# Patient Record
Sex: Male | Born: 1970 | Race: White | Hispanic: Yes | Marital: Single | Smoking: Current every day smoker
Health system: Southern US, Community
[De-identification: ages and names within clinical notes are randomized; demographics above are authoritative.]

## PROBLEM LIST (undated history)

## (undated) DIAGNOSIS — E669 Obesity, unspecified: Secondary | ICD-10-CM

## (undated) DIAGNOSIS — R7302 Impaired glucose tolerance (oral): Secondary | ICD-10-CM

## (undated) DIAGNOSIS — R030 Elevated blood-pressure reading, without diagnosis of hypertension: Secondary | ICD-10-CM

## (undated) DIAGNOSIS — R55 Syncope and collapse: Secondary | ICD-10-CM

## (undated) HISTORY — PX: BACK SURGERY: SHX140

---

## 2000-11-29 HISTORY — PX: BACK SURGERY: SHX140

## 2009-04-09 ENCOUNTER — Emergency Department (HOSPITAL_COMMUNITY): Admission: EM | Admit: 2009-04-09 | Discharge: 2009-04-09 | Payer: Self-pay | Admitting: Emergency Medicine

## 2010-09-18 ENCOUNTER — Emergency Department (HOSPITAL_COMMUNITY): Admission: EM | Admit: 2010-09-18 | Discharge: 2010-09-18 | Payer: Self-pay | Admitting: Emergency Medicine

## 2011-02-10 LAB — DIFFERENTIAL
Basophils Absolute: 0.1 10*3/uL (ref 0.0–0.1)
Basophils Relative: 1 % (ref 0–1)
Lymphocytes Relative: 29 % (ref 12–46)
Monocytes Absolute: 1 10*3/uL (ref 0.1–1.0)
Monocytes Relative: 11 % (ref 3–12)
Neutro Abs: 5.4 10*3/uL (ref 1.7–7.7)
Neutrophils Relative %: 56 % (ref 43–77)

## 2011-02-10 LAB — URINALYSIS, ROUTINE W REFLEX MICROSCOPIC
Glucose, UA: NEGATIVE mg/dL
Nitrite: NEGATIVE
Specific Gravity, Urine: 1.031 — ABNORMAL HIGH (ref 1.005–1.030)
pH: 7 (ref 5.0–8.0)

## 2011-02-10 LAB — POCT I-STAT, CHEM 8
BUN: 15 mg/dL (ref 6–23)
Chloride: 103 mEq/L (ref 96–112)
Glucose, Bld: 93 mg/dL (ref 70–99)
HCT: 46 % (ref 39.0–52.0)
Potassium: 4 mEq/L (ref 3.5–5.1)

## 2011-02-10 LAB — CBC
HCT: 43.7 % (ref 39.0–52.0)
Hemoglobin: 15 g/dL (ref 13.0–17.0)
MCHC: 34.3 g/dL (ref 30.0–36.0)
WBC: 9.6 10*3/uL (ref 4.0–10.5)

## 2011-03-09 LAB — CBC
HCT: 41.1 % (ref 39.0–52.0)
Hemoglobin: 14.3 g/dL (ref 13.0–17.0)
MCV: 90.8 fL (ref 78.0–100.0)
Platelets: 257 10*3/uL (ref 150–400)
RBC: 4.53 MIL/uL (ref 4.22–5.81)
WBC: 11.1 10*3/uL — ABNORMAL HIGH (ref 4.0–10.5)

## 2011-03-09 LAB — DIFFERENTIAL
Eosinophils Relative: 2 % (ref 0–5)
Lymphocytes Relative: 17 % (ref 12–46)
Lymphs Abs: 1.9 10*3/uL (ref 0.7–4.0)
Monocytes Absolute: 0.6 10*3/uL (ref 0.1–1.0)
Monocytes Relative: 6 % (ref 3–12)

## 2011-03-09 LAB — BASIC METABOLIC PANEL
Chloride: 105 mEq/L (ref 96–112)
GFR calc Af Amer: >60 mL/min (ref >60)
GFR calc non Af Amer: >60 mL/min (ref >60)
Potassium: 4 mEq/L (ref 3.5–5.1)
Sodium: 138 mEq/L (ref 135–145)

## 2013-11-10 ENCOUNTER — Emergency Department (HOSPITAL_COMMUNITY): Payer: Self-pay

## 2013-11-10 ENCOUNTER — Encounter (HOSPITAL_COMMUNITY): Payer: Self-pay | Admitting: Emergency Medicine

## 2013-11-10 DIAGNOSIS — R071 Chest pain on breathing: Secondary | ICD-10-CM | POA: Insufficient documentation

## 2013-11-10 DIAGNOSIS — I252 Old myocardial infarction: Secondary | ICD-10-CM | POA: Insufficient documentation

## 2013-11-10 DIAGNOSIS — F17200 Nicotine dependence, unspecified, uncomplicated: Secondary | ICD-10-CM | POA: Insufficient documentation

## 2013-11-10 NOTE — ED Notes (Signed)
The pt has a sudden onset of rt sided upper chest pain while driving approx 35 minutes ago.  No previous history.  No son no nv just pain.  The pain is better at present

## 2013-11-10 NOTE — ED Notes (Signed)
The pt reports that he had a mi when he was 58

## 2013-11-11 ENCOUNTER — Emergency Department (HOSPITAL_COMMUNITY)
Admission: EM | Admit: 2013-11-11 | Discharge: 2013-11-11 | Disposition: A | Discharge status: Home / Self Care | Payer: Self-pay | Attending: Emergency Medicine | Admitting: Emergency Medicine

## 2013-11-11 DIAGNOSIS — R079 Chest pain, unspecified: Secondary | ICD-10-CM

## 2013-11-11 LAB — COMPREHENSIVE METABOLIC PANEL
ALT: 67 U/L — ABNORMAL HIGH (ref 0–53)
Albumin: 4.7 g/dL (ref 3.5–5.2)
Alkaline Phosphatase: 74 U/L (ref 39–117)
BUN: 16 mg/dL (ref 6–23)
Chloride: 101 mEq/L (ref 96–112)
Potassium: 3.7 mEq/L (ref 3.5–5.1)
Sodium: 140 mEq/L (ref 135–145)
Total Bilirubin: 0.3 mg/dL (ref 0.3–1.2)
Total Protein: 7.9 g/dL (ref 6.0–8.3)

## 2013-11-11 LAB — CBC WITH DIFFERENTIAL/PLATELET
Basophils Relative: 1 % (ref 0–1)
HCT: 44.3 % (ref 39.0–52.0)
Lymphs Abs: 4.1 10*3/uL — ABNORMAL HIGH (ref 0.7–4.0)
MCHC: 35 g/dL (ref 30.0–36.0)
Monocytes Relative: 7 % (ref 3–12)
Neutro Abs: 3.7 10*3/uL (ref 1.7–7.7)
Neutrophils Relative %: 43 % (ref 43–77)
Platelets: 255 10*3/uL (ref 150–400)
WBC: 8.7 10*3/uL (ref 4.0–10.5)

## 2013-11-11 LAB — TROPONIN I: Troponin I: <0.3 ng/mL (ref <0.30)

## 2013-11-11 MED ORDER — HYDROMORPHONE HCL PF 1 MG/ML IJ SOLN
1.0000 mg | Freq: Once | INTRAMUSCULAR | Status: AC
  Administered 2013-11-11: 1 mg via INTRAMUSCULAR
  Filled 2013-11-11: qty 1

## 2013-11-11 MED ORDER — OXYCODONE-ACETAMINOPHEN 5-325 MG PO TABS
2.0000 | ORAL_TABLET | Freq: Once | ORAL | Status: AC
  Administered 2013-11-11: 2 via ORAL
  Filled 2013-11-11: qty 2

## 2013-11-11 MED ORDER — HYDROCODONE-ACETAMINOPHEN 5-325 MG PO TABS: 1.0000 | ORAL_TABLET | ORAL | Status: DC | PRN

## 2013-11-11 MED ORDER — IBUPROFEN 400 MG PO TABS
600.0000 mg | ORAL_TABLET | Freq: Once | ORAL | Status: AC
  Administered 2013-11-11: 600 mg via ORAL
  Filled 2013-11-11 (×2): qty 1

## 2013-11-11 NOTE — ED Notes (Signed)
Gave pt ice pack.

## 2013-11-11 NOTE — ED Provider Notes (Signed)
CSN: [id number]     Arrival date & time 11/10/13  2304 History   First MD Initiated Contact with Patient 11/11/13 0058     Chief Complaint  Patient presents with  . Chest Pain    HPI Patient presents with acute onset right-sided chest pain while driving in the car and laughing with his wife.  He states his pain was sharp and severe.  His pain is constant.  His pain is worse with deep breathing and with palpation of his right anterior chest.  No shortness of breath.  No recent illness.  No fevers or chills.  Pain is mild to moderate in severity Past Medical History  Diagnosis Date  . MI (myocardial infarction)    History reviewed. No pertinent past surgical history. No family history on file. History  Substance Use Topics  . Smoking status: Current Every Day Smoker  . Smokeless tobacco: Not on file  . Alcohol Use: Yes    Review of Systems  All other systems reviewed and are negative.    Allergies  Review of patient's allergies indicates not on file.  Home Medications   Current Outpatient Rx  Name  Route  Sig  Dispense  Refill  . HYDROcodone-acetaminophen (NORCO/VICODIN) 5-325 MG per tablet   Oral   Take 1 tablet by mouth every 4 (four) hours as needed for moderate pain.   15 tablet   0    BP 115/57  Pulse 79  Temp(Src) 99.2 F (37.3 C) (Oral)  Resp 21  Wt 245 lb 6 oz (111.301 kg)  SpO2 90% Physical Exam  Nursing note and vitals reviewed. Constitutional: He is oriented to person, place, and time. He appears well-developed and well-nourished.  HENT:  Head: Normocephalic and atraumatic.  Eyes: EOM are normal.  Neck: Normal range of motion.  Cardiovascular: Normal rate, regular rhythm, normal heart sounds and intact distal pulses.   Pulmonary/Chest: Effort normal and breath sounds normal. No respiratory distress.  Tenderness of right anterior chest wall.  No rash.  Abdominal: Soft. He exhibits no distension. There is no tenderness.  Musculoskeletal: Normal range  of motion.  Neurological: He is alert and oriented to person, place, and time.  Skin: Skin is warm and dry.  Psychiatric: He has a normal mood and affect. Judgment normal.    ED Course  Procedures (including critical care time) Labs Review Labs Reviewed  CBC WITH DIFFERENTIAL - Abnormal; Notable for the following:    Lymphocytes Relative 47 (*)    Lymphs Abs 4.1 (*)    All other components within normal limits  COMPREHENSIVE METABOLIC PANEL - Abnormal; Notable for the following:    Glucose, Bld 128 (*)    AST 70 (*)    ALT 67 (*)    GFR calc non Af Amer 66 (*)    GFR calc Af Amer 77 (*)    All other components within normal limits  TROPONIN I  D-DIMER, QUANTITATIVE   Imaging Review Dg Chest 2 View  11/11/2013   CLINICAL DATA:  Stabbing right-sided chest pain; shortness of breath. Numbness and tingling in the finger tips.  EXAM: CHEST  2 VIEW  COMPARISON:  None.  FINDINGS: The lungs are well-aerated. Minimal left basilar atelectasis or scarring is noted. There is no evidence of pleural effusion or pneumothorax.  The heart is borderline normal in size; the mediastinal contour is within normal limits. No acute osseous abnormalities are seen.  IMPRESSION: Minimal left basilar atelectasis or scarring noted; lungs otherwise clear.  Electronically Signed   By: Roanna Raider M.D.   On: 11/11/2013 00:30    EKG Interpretation    Date/Time:  Saturday November 10 2013 23:10:13 EST Ventricular Rate:  84 PR Interval:  178 QRS Duration: 78 QT Interval:  366 QTC Calculation: 432 R Axis:   67 Text Interpretation:  Normal sinus rhythm with sinus arrhythmia Nonspecific T wave abnormality Abnormal ECG No old tracing to compare Confirmed by Yaneth Fairbairn  MD, Lillyonna Armstead ([id number]) on 11/11/2013 1:14:05 AM            MDM   1. Chest pain    Muscular skeletal right-sided chest pain.  Likely from muscular strain while laughing.  D-dimer, chest x-ray, troponin, EKG without significant abnormalities.   Pain improved after pain medication.  Discharged in good condition.    Lyanne Co, MD 11/11/13 [phone]

## 2013-12-24 ENCOUNTER — Emergency Department (HOSPITAL_COMMUNITY)
Admission: EM | Admit: 2013-12-24 | Discharge: 2013-12-24 | Disposition: A | Discharge status: Home / Self Care | Payer: Self-pay | Attending: Emergency Medicine | Admitting: Emergency Medicine

## 2013-12-24 ENCOUNTER — Emergency Department (HOSPITAL_COMMUNITY): Payer: Self-pay

## 2013-12-24 ENCOUNTER — Encounter (HOSPITAL_COMMUNITY): Payer: Self-pay | Admitting: Emergency Medicine

## 2013-12-24 DIAGNOSIS — I252 Old myocardial infarction: Secondary | ICD-10-CM | POA: Insufficient documentation

## 2013-12-24 DIAGNOSIS — F17200 Nicotine dependence, unspecified, uncomplicated: Secondary | ICD-10-CM | POA: Insufficient documentation

## 2013-12-24 DIAGNOSIS — R0789 Other chest pain: Secondary | ICD-10-CM

## 2013-12-24 DIAGNOSIS — R110 Nausea: Secondary | ICD-10-CM | POA: Insufficient documentation

## 2013-12-24 DIAGNOSIS — E669 Obesity, unspecified: Secondary | ICD-10-CM | POA: Insufficient documentation

## 2013-12-24 DIAGNOSIS — R0602 Shortness of breath: Secondary | ICD-10-CM | POA: Insufficient documentation

## 2013-12-24 HISTORY — DX: Impaired glucose tolerance (oral): R73.02

## 2013-12-24 HISTORY — DX: Elevated blood-pressure reading, without diagnosis of hypertension: R03.0

## 2013-12-24 HISTORY — DX: Syncope and collapse: R55

## 2013-12-24 HISTORY — DX: Obesity, unspecified: E66.9

## 2013-12-24 LAB — BASIC METABOLIC PANEL
BUN: 13 mg/dL (ref 6–23)
CALCIUM: 9.3 mg/dL (ref 8.4–10.5)
CHLORIDE: 101 meq/L (ref 96–112)
CO2: 23 mEq/L (ref 19–32)
CREATININE: 0.86 mg/dL (ref 0.50–1.35)
GFR calc non Af Amer: >90 mL/min (ref >90)
Glucose, Bld: 146 mg/dL — ABNORMAL HIGH (ref 70–99)
Potassium: 4 mEq/L (ref 3.7–5.3)
Sodium: 139 mEq/L (ref 137–147)

## 2013-12-24 LAB — CBC
HCT: 42.6 % (ref 39.0–52.0)
Hemoglobin: 15 g/dL (ref 13.0–17.0)
MCH: 30.9 pg (ref 26.0–34.0)
MCHC: 35.2 g/dL (ref 30.0–36.0)
MCV: 87.8 fL (ref 78.0–100.0)
PLATELETS: 203 10*3/uL (ref 150–400)
RBC: 4.85 MIL/uL (ref 4.22–5.81)
RDW: 12.5 % (ref 11.5–15.5)
WBC: 6 10*3/uL (ref 4.0–10.5)

## 2013-12-24 LAB — POCT I-STAT TROPONIN I: Troponin i, poc: 0 ng/mL (ref 0.00–0.08)

## 2013-12-24 LAB — TROPONIN I: Troponin I: <0.3 ng/mL (ref <0.30)

## 2013-12-24 MED ORDER — AMLODIPINE BESYLATE 2.5 MG PO TABS
2.5000 mg | ORAL_TABLET | Freq: Every day | ORAL | Status: DC
  Filled 2013-12-24: qty 1

## 2013-12-24 MED ORDER — ONDANSETRON HCL 4 MG/2ML IJ SOLN
4.0000 mg | Freq: Once | INTRAMUSCULAR | Status: AC
  Administered 2013-12-24: 4 mg via INTRAVENOUS
  Filled 2013-12-24: qty 2

## 2013-12-24 MED ORDER — MORPHINE SULFATE 4 MG/ML IJ SOLN
4.0000 mg | Freq: Once | INTRAMUSCULAR | Status: AC
  Administered 2013-12-24: 4 mg via INTRAVENOUS
  Filled 2013-12-24: qty 1

## 2013-12-24 MED ORDER — NITROGLYCERIN 0.4 MG SL SUBL
0.4000 mg | SUBLINGUAL_TABLET | SUBLINGUAL | Status: AC | PRN
  Administered 2013-12-24 (×3): 0.4 mg via SUBLINGUAL

## 2013-12-24 MED ORDER — ASPIRIN 325 MG PO TABS
325.0000 mg | ORAL_TABLET | Freq: Every day | ORAL | Status: DC
  Administered 2013-12-24: 325 mg via ORAL
  Filled 2013-12-24: qty 1

## 2013-12-24 MED ORDER — GI COCKTAIL ~~LOC~~
30.0000 mL | Freq: Once | ORAL | Status: AC
  Administered 2013-12-24: 30 mL via ORAL
  Filled 2013-12-24: qty 30

## 2013-12-24 NOTE — ED Notes (Signed)
PA Hannah at bedside. 

## 2013-12-24 NOTE — ED Notes (Signed)
Family at bedside. 

## 2013-12-24 NOTE — ED Provider Notes (Signed)
7:22 PM last trop negative.  patient seen by Dr. Mahala MenghiniSamtani.  Norvasc prescription ordered for discharge, but patient's BP have been in 110's.  Patient agrees to f/u outpatient with Dr. Algie CofferKadakia.     Jaidon Sponsel L. Trisha Mangleriplett, PA-C 12/24/13 1925

## 2013-12-24 NOTE — ED Notes (Signed)
Food tray ordered for patient.

## 2013-12-24 NOTE — Discharge Instructions (Signed)
Chest Pain (Nonspecific) Chest pain has many causes. Your pain could be caused by something serious, such as a heart attack or a blood clot in the lungs. It could also be caused by something less serious, such as a chest bruise or a virus. Follow up with your doctor. More lab tests or other studies may be needed to find the cause of your pain. Most of the time, nonspecific chest pain will improve within 2 to 3 days of rest and mild pain medicine. HOME CARE  For chest bruises, you may put ice on the sore area for 15-20 minutes, 03-04 times a day. Do this only if it makes you feel better.  Put ice in a plastic bag.  Place a towel between the skin and the bag.  Rest for the next 2 to 3 days.  Go back to work if the pain improves.  See your doctor if the pain lasts longer than 1 to 2 weeks.  Only take medicine as told by your doctor.  Quit smoking if you smoke. GET HELP RIGHT AWAY IF:   There is more pain or pain that spreads to the arm, neck, jaw, back, or belly (abdomen).  You have shortness of breath.  You cough more than usual or cough up blood.  You have very bad back or belly pain, feel sick to your stomach (nauseous), or throw up (vomit).  You have very bad weakness.  You pass out (faint).  You have a fever. Any of these problems may be serious and may be an emergency. Do not wait to see if the problems will go away. Get medical help right away. Call your local emergency services 911 in [place] Do not drive yourself to the hospital. MAKE SURE YOU:   Understand these instructions.  Will watch this condition.  Will get help right away if you or your child is not doing well or gets worse. Document Released: 05/03/2008 Document Revised: 02/07/2012 Document Reviewed: 05/03/2008 [facility] Patient Information 2014 [place] [place].  Chest Pain (Nonspecific) It is often hard to give a specific diagnosis for the cause of chest pain. There is always a chance that your pain could be  related to something serious, such as a heart attack or a blood clot in the lungs. You need to follow up with your caregiver for further evaluation. CAUSES   Heartburn.  Pneumonia or bronchitis.  Anxiety or stress.  Inflammation around your heart (pericarditis) or lung (pleuritis or pleurisy).  A blood clot in the lung.  A collapsed lung (pneumothorax). It can develop suddenly on its own (spontaneous pneumothorax) or from injury (trauma) to the chest.  Shingles infection (herpes zoster virus). The chest wall is composed of bones, muscles, and cartilage. Any of these can be the source of the pain.  The bones can be bruised by injury.  The muscles or cartilage can be strained by coughing or overwork.  The cartilage can be affected by inflammation and become sore (costochondritis). DIAGNOSIS  Lab tests or other studies, such as X-rays, electrocardiography, stress testing, or cardiac imaging, may be needed to find the cause of your pain.  TREATMENT   Treatment depends on what may be causing your chest pain. Treatment may include:  Acid blockers for heartburn.  Anti-inflammatory medicine.  Pain medicine for inflammatory conditions.  Antibiotics if an infection is present.  You may be advised to change lifestyle habits. This includes stopping smoking and avoiding alcohol, caffeine, and chocolate.  You may be advised to keep  your head raised (elevated) when sleeping. This reduces the chance of acid going backward from your stomach into your esophagus.  Most of the time, nonspecific chest pain will improve within 2 to 3 days with rest and mild pain medicine. HOME CARE INSTRUCTIONS   If antibiotics were prescribed, take your antibiotics as directed. Finish them even if you start to feel better.  For the next few days, avoid physical activities that bring on chest pain. Continue physical activities as directed.  Do not smoke.  Avoid drinking alcohol.  Only take  over-the-counter or prescription medicine for pain, discomfort, or fever as directed by your caregiver.  Follow your caregiver's suggestions for further testing if your chest pain does not go away.  Keep any follow-up appointments you made. If you do not go to an appointment, you could develop lasting (chronic) problems with pain. If there is any problem keeping an appointment, you must call to reschedule. SEEK MEDICAL CARE IF:   You think you are having problems from the medicine you are taking. Read your medicine instructions carefully.  Your chest pain does not go away, even after treatment.  You develop a rash with blisters on your chest. SEEK IMMEDIATE MEDICAL CARE IF:   You have increased chest pain or pain that spreads to your arm, neck, jaw, back, or abdomen.  You develop shortness of breath, an increasing cough, or you are coughing up blood.  You have severe back or abdominal pain, feel nauseous, or vomit.  You develop severe weakness, fainting, or chills.  You have a fever. THIS IS AN EMERGENCY. Do not wait to see if the pain will go away. Get medical help at once. Call your local emergency services (911 in [place] Do not drive yourself to the hospital. MAKE SURE YOU:   Understand these instructions.  Will watch your condition.  Will get help right away if you are not doing well or get worse. Document Released: 08/25/2005 Document Revised: 02/07/2012 Document Reviewed: 06/20/2008 [facility] Patient Information 2014 [place], [place].

## 2013-12-24 NOTE — ED Provider Notes (Signed)
CSN: [id number]     Arrival date & time 12/24/13  1114 History   First MD Initiated Contact with Patient 12/24/13 1230     Chief Complaint  Patient presents with  . Chest Pain   (Consider location/radiation/quality/duration/timing/severity/associated sxs/prior Treatment) HPI Comments: Patient is a 43 year old male who presents today with chest pain. The pain began on Friday. On Friday he began to have pain "in his heart" that felt like needles stabbing him. The pain does not radiate and stays in his left chest. He had some mild associated shortness of breath and nausea. This resolves spontaneously after approximately one hour and 20 minutes. He felt well over the weekend, but did not want to "overdo it". He woke up this morning and was feeling well, but went to a meeting at work. At this meeting he began to have sharp pain in his left chest that was associated with shortness of breath and nausea. Again, the pain does not radiate. His pain responded well to morphine. Nitroglycerin only improved his pain moderately. He reports that he had a an MI at the age of 35. He can give me very little information about this, but states that he had to undergo chest compressions and felt very calm while this was happening. He was told at that time that he had a heart attack and had to stay in the hospital. He does not take any medications on a daily basis. He has not seen a doctor in many years. He has had no cardiac followup.  Patient is a 43 y.o. male presenting with chest pain. The history is provided by the patient. No language interpreter was used.  Chest Pain Associated symptoms: nausea and shortness of breath   Associated symptoms: no abdominal pain, no fever, no palpitations and not vomiting     Past Medical History  Diagnosis Date  . MI (myocardial infarction)     "at 43 years old"   Past Surgical History  Procedure Laterality Date  . Back surgery     No family history on file. History   Substance Use Topics  . Smoking status: Current Every Day Smoker  . Smokeless tobacco: Not on file  . Alcohol Use: Yes    Review of Systems  Constitutional: Negative for fever and chills.  Respiratory: Positive for shortness of breath.   Cardiovascular: Positive for chest pain. Negative for palpitations.  Gastrointestinal: Positive for nausea. Negative for vomiting and abdominal pain.  All other systems reviewed and are negative.    Allergies  Review of patient's allergies indicates no known allergies.  Home Medications  No current outpatient prescriptions on file. BP 126/72  Pulse 78  Temp(Src) 97.9 F (36.6 C) (Oral)  Resp 23  SpO2 97% Physical Exam  Nursing note and vitals reviewed. Constitutional: He is oriented to person, place, and time. He appears well-developed and well-nourished. No distress.  obese  HENT:  Head: Normocephalic and atraumatic.  Right Ear: External ear normal.  Left Ear: External ear normal.  Nose: Nose normal.  Eyes: Conjunctivae are normal.  Neck: Normal range of motion. No tracheal deviation present.  Cardiovascular: Normal rate, regular rhythm and normal heart sounds.   Pulmonary/Chest: Effort normal and breath sounds normal. No stridor.  Abdominal: Soft. He exhibits no distension. There is no tenderness.  Musculoskeletal: Normal range of motion.  Neurological: He is alert and oriented to person, place, and time.  Skin: Skin is warm and dry. He is not diaphoretic.  Psychiatric: He has a normal  mood and affect. His behavior is normal.    ED Course  Procedures (including critical care time) Labs Review Labs Reviewed  BASIC METABOLIC PANEL - Abnormal; Notable for the following:    Glucose, Bld 146 (*)    All other components within normal limits  CBC  TROPONIN I  POCT I-STAT TROPONIN I   Imaging Review Dg Chest 2 View  12/24/2013   CLINICAL DATA:  Left-sided chest pain  EXAM: CHEST  2 VIEW  COMPARISON:  DG CHEST 2 VIEW dated  11/10/2013  FINDINGS: The heart size and mediastinal contours are within normal limits. Both lungs are clear. The visualized skeletal structures are unremarkable.  IMPRESSION: No active cardiopulmonary disease.   Electronically Signed   By: Elige KoHetal  Patel   On: 12/24/2013 12:56    EKG Interpretation    Date/Time:  Monday December 24 2013 11:21:11 EST Ventricular Rate:  77 PR Interval:  174 QRS Duration: 80 QT Interval:  374 QTC Calculation: 423 R Axis:   59 Text Interpretation:  Sinus rhythm with marked sinus arrhythmia Otherwise normal ECG When compared with ECG of 11/10/2013 No significant change was found Confirmed by [facility]  MD, Nicholos JohnsKATHLEEN [phone]([id number]) on 12/24/2013 12:31:31 PM           4:36 PM Dr. Mahala MenghiniSamtani to evaluate patient.  MDM   1. Atypical chest pain    Pt presents to ED with atypical chest pain. Gives hx of prior MI at age 43. He has had no follow up. I discussed this case with Dr. Mahala MenghiniSamtani as I felt admission for ACS rule out was appropriate. He was able to set up outpatient cardiac testing with Dr. Algie CofferKadakia. If second troponin is negative, patient ok to go home per Dr. Mahala MenghiniSamtani. Patient / Family / Caregiver informed of clinical course, understand medical decision-making process, and agree with plan.     Mora BellmanHannah S Davonn Flanery, PA-C 12/24/13 1904

## 2013-12-24 NOTE — Consult Note (Addendum)
[facility] History and Physical  NICHOLAS TROMPETER WJX:[id number] DOB: February 20, 1971 DOA: 12/24/2013  Referring physician: Emergency room PCP: No PCP Per Patient  Specialists: Consulted Dr. Algie Coffer  Chief Complaint: Chest pain  HPI: Jonathan Brooks is a 43 y.o. male with possible impaired glucose tolerance, morbid obesity, borderline hypertension and a history of hospitalization in 2002 for a syncopal episode without further followup who presented to [facility] emergency room 1/26 with onset of left-sided chest pain. Chest pain has been going on since 12/21/13. States it is coming and going, nothing makes it better or worse. At the time that he had a, patient was very stressed out and was at that the public courts because he not take multiple traffic violation tickets and he was about to be put under arrest from the [facility] judiciary because of further traffic violations which she was not aware of. He states that the chest pain started then but eased off gradually over hour and a half period of time-he did not endorse any palpitations at that time nor any radiation of the pain into his neck into his arm nor was there any diaphoresis or any crushing-type nature to this pain. In either event, he went home on 1/23 and did not have a recurrence of chest pain until this morning 1/26 when he went from his home to the office and sat down at the desk. The pain was on the left side once again and because of the nature of the pain he decided to come back to the emergency room. Please note that he came to the emergency room 11/11/13 and was sent home as it was thought that his risk for major acute coronary episode was negligible. In the emergency room here today his point-of-care troponin is negative, his EKG is essentially unchanged from the prior EKG 12/14. He was given morphine which helped the pain to some extent, nitroglycerin did not help as much. He was given 4 tablets of aspirin 81 mg and  referred for admission. His random blood glucose on basic metabolic panel is 146 his chest x-ray is completely normal his blood count is completely normal  When I calculate his heart score it is 3.  This indicates that he may benefit from a workup for this issue, but likely does not need to be admitted as his relative risk for MACE is low  I requested the emergency room to do a formal troponin stat as the point-of-care troponin was done at 12 PM. I discussed the patient's case with Dr. Orpah Cobb Dr. Algie Coffer recommends starting him on amlodipine low-dose 2.5 mg if his blood pressure will tolerate this He recommends that if the formal troponin is negative, patient can also be safely discharged and be seen at his office later this week for exercise stress test. I suspect that with his habitus, he may benefit from sleep study as well Lastly, he would do well to meet with an outpatient nutritionist to get some pointers and tips with regards to weight loss.   Review of Systems: The patient denies fever chills nausea vomiting or vision double vision weakness or any one side of the body diarrhea cough cold + Chest pain + stress + obesity + Snoring  Past Medical History  Diagnosis Date  . Syncope     2002  . Obesity   . Impaired glucose tolerance   . Borderline hypertension    Past Surgical History  Procedure Laterality Date  . Back surgery  Social History:  History   Social History Narrative   [place]   Has a girlfriend   Does not drink or do drugs    No Known Allergies  Family History  Problem Relation Age of Onset  . Heart attack Father     5760   Prior to Admission medications   Not on File   Physical Exam: Filed Vitals:   12/24/13 1515 12/24/13 1546 12/24/13 1710 12/24/13 1715  BP: 112/62 127/58 117/102 111/95  Pulse: 104 72 68 82  Temp:      TempSrc:      Resp: 27 24 17 23   SpO2: 93% 91% 94% 95%     General:  EOMI, NCAT  Eyes:  No blurred or double vision no pallor or icterus  ENT: Throat clear, Mallampati 3  Neck: Soft supple no JVD  Cardiovascular: S1-S2 no murmur rub or gallop  Respiratory: Clinically clear  Abdomen: Obese nontender no rebound  Skin: No lower extremity edema  Musculoskeletal: Range of motion intact  Psychiatric: Euthymic but seems anxious  Neurologic: Intact  Labs on Admission:  Basic Metabolic Panel:  Recent Labs Lab 12/24/13 1132  NA 139  K 4.0  CL 101  CO2 23  GLUCOSE 146*  BUN 13  CREATININE 0.86  CALCIUM 9.3   Liver Function Tests: No results found for this basename: AST, ALT, ALKPHOS, BILITOT, PROT, ALBUMIN,  in the last 168 hours No results found for this basename: LIPASE, AMYLASE,  in the last 168 hours No results found for this basename: AMMONIA,  in the last 168 hours CBC:  Recent Labs Lab 12/24/13 1132  WBC 6.0  HGB 15.0  HCT 42.6  MCV 87.8  PLT 203   Cardiac Enzymes: No results found for this basename: CKTOTAL, CKMB, CKMBINDEX, TROPONINI,  in the last 168 hours  BNP (last 3 results) No results found for this basename: PROBNP,  in the last 8760 hours CBG: No results found for this basename: GLUCAP,  in the last 168 hours  Radiological Exams on Admission: Dg Chest 2 View  12/24/2013   CLINICAL DATA:  Left-sided chest pain  EXAM: CHEST  2 VIEW  COMPARISON:  DG CHEST 2 VIEW dated 11/10/2013  FINDINGS: The heart size and mediastinal contours are within normal limits. Both lungs are clear. The visualized skeletal structures are unremarkable.  IMPRESSION: No active cardiopulmonary disease.   Electronically Signed   By: Elige KoHetal  Patel   On: 12/24/2013 12:56    EKG: Independently reviewed. As above   Mahala MenghiniSAMTANI, [facility] Hospital[facility]AI-GURMUKH [facility] Pager [phone]  If 7PM-7AM, please contact night-coverage [email or web address] Password [email or web address] 12/24/2013, 5:53 PM

## 2013-12-24 NOTE — ED Notes (Signed)
Holding norvasc at this time due to BP in normal limits.

## 2013-12-24 NOTE — ED Notes (Signed)
Pt is here with needle stabbing pain in his heart since Friday, pain continues to worsen, sob, and nauseated.

## 2013-12-25 NOTE — ED Provider Notes (Signed)
Medical screening examination/treatment/procedure(s) were performed by non-physician practitioner and as supervising physician I was immediately available for consultation/collaboration.  EKG Interpretation    Date/Time:  Monday December 24 2013 11:21:11 EST Ventricular Rate:  77 PR Interval:  174 QRS Duration: 80 QT Interval:  374 QTC Calculation: 423 R Axis:   59 Text Interpretation:  Sinus rhythm with marked sinus arrhythmia Otherwise normal ECG When compared with ECG of 11/10/2013 No significant change was found Confirmed by [facility]  MD, Ashanti Ratti [phone]([id number]) on 12/24/2013 12:31:31 PM              Jonathan AngerKathleen M Glorian Mcdonell, DO 12/25/13 [phone]

## 2016-06-22 ENCOUNTER — Ambulatory Visit (HOSPITAL_COMMUNITY)
Admission: RE | Admit: 2016-06-22 | Discharge: 2016-06-22 | Disposition: A | Discharge status: Home / Self Care | Payer: Self-pay | Source: Ambulatory Visit | Attending: Physician Assistant | Admitting: Physician Assistant

## 2016-06-22 ENCOUNTER — Ambulatory Visit (INDEPENDENT_AMBULATORY_CARE_PROVIDER_SITE_OTHER): Payer: Worker's Compensation

## 2016-06-22 ENCOUNTER — Other Ambulatory Visit: Payer: Self-pay | Admitting: Physician Assistant

## 2016-06-22 ENCOUNTER — Ambulatory Visit (INDEPENDENT_AMBULATORY_CARE_PROVIDER_SITE_OTHER): Payer: Self-pay | Admitting: Physician Assistant

## 2016-06-22 VITALS — BP 120/74 | HR 71 | Temp 98.8°F | Resp 20 | Ht 69.0 in | Wt 261.6 lb

## 2016-06-22 DIAGNOSIS — M545 Low back pain: Secondary | ICD-10-CM

## 2016-06-22 DIAGNOSIS — R42 Dizziness and giddiness: Secondary | ICD-10-CM | POA: Insufficient documentation

## 2016-06-22 DIAGNOSIS — W11XXXA Fall on and from ladder, initial encounter: Secondary | ICD-10-CM | POA: Insufficient documentation

## 2016-06-22 LAB — POCT CBC
Granulocyte percent: 55.2 %G (ref 37–80)
HCT, POC: 44.5 % (ref 43.5–53.7)
HEMOGLOBIN: 15.4 g/dL (ref 14.1–18.1)
Lymph, poc: 3.2 (ref 0.6–3.4)
MCH, POC: 30.6 pg (ref 27–31.2)
MCHC: 34.7 g/dL (ref 31.8–35.4)
MCV: 88.1 fL (ref 80–97)
MID (CBC): 0.7 (ref 0–0.9)
MPV: 7.9 fL (ref 0–99.8)
POC Granulocyte: 4.7 (ref 2–6.9)
POC LYMPH %: 37.1 % (ref 10–50)
POC MID %: 7.7 %M (ref 0–12)
Platelet Count, POC: 222 10*3/uL (ref 142–424)
RBC: 5.05 M/uL (ref 4.69–6.13)
RDW, POC: 13.6 %
WBC: 8.6 10*3/uL (ref 4.6–10.2)

## 2016-06-22 LAB — POCT URINALYSIS DIP (MANUAL ENTRY)
Bilirubin, UA: NEGATIVE
Blood, UA: NEGATIVE
Glucose, UA: NEGATIVE
Ketones, POC UA: NEGATIVE
Leukocytes, UA: NEGATIVE
NITRITE UA: NEGATIVE
PH UA: 5
Protein Ur, POC: NEGATIVE
Spec Grav, UA: 1.02
Urobilinogen, UA: 0.2

## 2016-06-22 LAB — GLUCOSE, POCT (MANUAL RESULT ENTRY): POC Glucose: 66 mg/dl — AB (ref 70–99)

## 2016-06-22 MED ORDER — MELOXICAM 15 MG PO TABS: 15.0000 mg | ORAL_TABLET | Freq: Every day | ORAL | 1 refills | Status: AC

## 2016-06-22 MED ORDER — CYCLOBENZAPRINE HCL 5 MG PO TABS: 5.0000 mg | ORAL_TABLET | Freq: Three times a day (TID) | ORAL | 1 refills | Status: AC | PRN

## 2016-06-22 NOTE — Patient Instructions (Addendum)
You are to go over to [facility] now for your CT scan.  We will be in touch with about your scan.   Address Address: [address] [place], [place], [place] [address]/  Phone: [phone].     IF you received an x-ray today, you will receive an invoice from [facility]. Please contact [facility] at [phone] with questions or concerns regarding your invoice.   IF you received labwork today, you will receive an invoice from [employer]. Please contact [facility] at [phone] with questions or concerns regarding your invoice.   Our billing staff will not be able to assist you with questions regarding bills from these companies.  You will be contacted with the lab results as soon as they are available. The fastest way to get your results is to activate your My Chart account. Instructions are located on the last page of this paperwork. If you have not heard from [place] regarding the results in 2 weeks, please contact this office.

## 2016-06-22 NOTE — Progress Notes (Signed)
Jonathan Brooks 1971-09-12 45 y.o.   Chief Complaint  Patient presents with  . Back Pain    Low back pain from a fall x yesterday     Date of Injury: 06/21/2016  History of Present Illness:  Presents for evaluation of work-related complaint.  Fell off ladder yesterday, 2 feet up on 2nd ladder step. He landed on back, hitting head, 2 fingers, hip and back..  Low back pain pursued.  Radiates down the left leg.  Left leg has some numbness.  Hurts most at the tailbone.     He took oxycodone--from brother-in-law.  Which he reports helped somewhat. No trouble with bowel movement or urination.  No blood in either.  No dizziness.  No loc.    ROS ROS otherwise unremarkable unless listed above.     Current medications and allergies reviewed and updated. Past medical history, family history, social history have been reviewed and updated.  BP 120/74 (BP Location: Left Arm, Patient Position: Sitting, Cuff Size: Large)   Pulse 71   Temp 98.8 F (37.1 C) (Oral)   Resp 20   Ht 5\' 9"  (1.753 m)   Wt 261 lb 9.6 oz (118.7 kg)   SpO2 97%   BMI 38.63 kg/m   Physical Exam  Constitutional: He is oriented to person, place, and time and well-developed, well-nourished, and in no distress. No distress.  HENT:  Head: Normocephalic and atraumatic.  Musculoskeletal:       Lumbar back: He exhibits bony tenderness. He exhibits normal range of motion, no swelling and no edema.  Low lumbar back pain,  Range of motion in forward flexion, lateral deviation and torso rotation are normal though pain incited with movements in all planes. Negative straight leg raise test.  Neurological: He is alert and oriented to person, place, and time.  Skin: Skin is warm and dry. He is not diaphoretic.  Psychiatric: Mood and affect normal.   Results for orders placed or performed in visit on 06/22/16  POCT CBC  Result Value Ref Range   WBC 8.6 4.6 - 10.2 K/uL   Lymph, poc 3.2 0.6 - 3.4   POC LYMPH PERCENT 37.1  10 - 50 %L   MID (cbc) 0.7 0 - 0.9   POC MID % 7.7 0 - 12 %M   POC Granulocyte 4.7 2 - 6.9   Granulocyte percent 55.2 37 - 80 %G   RBC 5.05 4.69 - 6.13 M/uL   Hemoglobin 15.4 14.1 - 18.1 g/dL   HCT, POC 16.1 09.6 - 53.7 %   MCV 88.1 80 - 97 fL   MCH, POC 30.6 27 - 31.2 pg   MCHC 34.7 31.8 - 35.4 g/dL   RDW, POC 04.5 %   Platelet Count, POC 222 142 - 424 K/uL   MPV 7.9 0 - 99.8 fL  POCT urinalysis dipstick  Result Value Ref Range   Color, UA yellow yellow   Clarity, UA clear clear   Glucose, UA negative negative   Bilirubin, UA negative negative   Ketones, POC UA negative negative   Spec Grav, UA 1.020    Blood, UA negative negative   pH, UA 5.0    Protein Ur, POC negative negative   Urobilinogen, UA 0.2    Nitrite, UA Negative Negative   Leukocytes, UA Negative Negative  POCT glucose (manual entry)  Result Value Ref Range   POC Glucose 66 (A) 70 - 99 mg/dl     Dg Sacrum/coccyx  Result Date: 06/22/2016  CLINICAL DATA:  Left-sided back pain after jumping off ladder EXAM: SACRUM AND COCCYX - 2+ VIEW COMPARISON:  CT abdomen pelvis of 09/18/2010 FINDINGS: The sacrococcygeal elements are in normal alignment. No fracture is seen. The pelvic rami are intact. Both hips appear to be normally position. The SI joints are corticated. The sacral foramina appear corticated. IMPRESSION: Negative. Electronically Signed   By: Dwyane Dee M.D.   On: 06/22/2016 12:07  Dg Hip Unilat W Or W/o Pelvis 2-3 Views Left  Result Date: 06/22/2016 CLINICAL DATA:  Left-sided back pain after jumping off ladder EXAM: DG HIP (WITH OR WITHOUT PELVIS) 2-3V LEFT COMPARISON:  None. FINDINGS: The hip joint spaces appear well preserved. No fracture is seen. The pelvic rami are intact. IMPRESSION: Negative. Electronically Signed   By: Dwyane Dee M.D.   On: 06/22/2016 12:08  Orthostatic VS for the past 24 hrs (Last 3 readings):  BP- Lying Pulse- Lying BP- Sitting Pulse- Sitting BP- Standing at 0 minutes Pulse-  Standing at 0 minutes  06/22/16 1247 112/75 58 127/85 66 134/90 76     Assessment and Plan: 45 year old male is here today for cc of back pain, and dizziness With concerning dizziness and headache. Muscle relaxant and low dose anti-inflammatory at this time.  Advised to return in 3 days.  Fall from ladder, initial encounter - Plan: CT Head Wo Contrast, CANCELED: CT Head Wo Contrast  Midline low back pain, with sciatica presence unspecified - Plan: DG Sacrum/Coccyx, DG HIP UNILAT W OR W/O PELVIS 2-3 VIEWS LEFT, POCT glucose (manual entry), meloxicam (MOBIC) 15 MG tablet, cyclobenzaprine (FLEXERIL) 5 MG tablet  Dizziness - Plan: POCT CBC, POCT urinalysis dipstick, POCT glucose (manual entry), CT Head Wo Contrast, CANCELED: CT Head Wo Contrast   Addendum: contacted patient of negative head CT.  Advised mobic and flexeril.  To return in 3 days

## 2016-06-25 ENCOUNTER — Ambulatory Visit (INDEPENDENT_AMBULATORY_CARE_PROVIDER_SITE_OTHER): Payer: Self-pay | Admitting: Physician Assistant

## 2016-06-25 VITALS — BP 124/80 | HR 77 | Temp 98.5°F | Resp 18 | Ht 69.0 in | Wt 259.0 lb

## 2016-06-25 DIAGNOSIS — W11XXXA Fall on and from ladder, initial encounter: Secondary | ICD-10-CM

## 2016-06-25 DIAGNOSIS — M545 Low back pain: Secondary | ICD-10-CM

## 2016-06-25 DIAGNOSIS — S39012A Strain of muscle, fascia and tendon of lower back, initial encounter: Secondary | ICD-10-CM

## 2016-06-25 NOTE — Progress Notes (Signed)
Jonathan Brooks 10/29/1971 45 y.o.   Chief Complaint  Patient presents with  . Follow-up    WORKER'S COMP     Date of Injury: 06/22/2016  History of Present Illness:  Presents for evaluation of work-related complaint.  Patient is here today following fall from the latter. He was here 3 days ago. Patient states that his pain in his lower back has improved somewhat though is still very present. He has no radiating pain down the extremities or instability. No incontinence. Hurts more to the left side. He states that the dizziness that he had upon initial visit has now resolved. Patient reports that the Mobic made him incredibly nauseous and he had stopped. The Flexeril also causing him to be very tired.  ROS ROS otherwise unremarkable unless listed above.     Current medications and allergies reviewed and updated. Past medical history, family history, social history have been reviewed and updated.   Physical Exam  Constitutional: He is well-developed, well-nourished, and in no distress. No distress.  HENT:  Head: Normocephalic and atraumatic.  Cardiovascular: Normal rate and regular rhythm.   Pulmonary/Chest: Effort normal and breath sounds normal. No respiratory distress.  Musculoskeletal:  Low back pain upon palpation of the left side of low back and low thoracic.  Pain with lateral devation to the left side and torso rotation.  forwatrd flexion also tender however, rom appears normal.    Skin: Skin is warm and dry. He is not diaphoretic.  Psychiatric: Mood and affect normal.     Assessment and Plan: 45 year old male is here today for cc of low bac pain follow up after 3 days. He continues to have the pain of low back, however dizziness has resolved. Advised to quit the mobic.  Continue restrictions (see letter).  Midline low back pain, with sciatica presence unspecified  Fall from ladder, initial encounter  Back strain, initial encounter  Trena Platt,  PA-C [facility] 8/17/20178:48 AM

## 2016-06-25 NOTE — Patient Instructions (Addendum)
IF you received an x-ray today, you will receive an invoice from [facility]. Please contact [facility] at [phone] with questions or concerns regarding your invoice.   IF you received labwork today, you will receive an invoice from [employer]. Please contact [facility] at [phone] with questions or concerns regarding your invoice.   Our billing staff will not be able to assist you with questions regarding bills from these companies.  You will be contacted with the lab results as soon as they are available. The fastest way to get your results is to activate your My Chart account. Instructions are located on the last page of this paperwork. If you have not heard from [place] regarding the results in 2 weeks, please contact this office.    Please perform daily stretches 3 times a day. He will take 3 of these pictures and perform the stretch as instructed. You may he did prior to the stretch. Ice directly after for 15 minutes. I would like you to Stop the Mobic at this time as this is causing you a lot of dizziness. You can start to the Tylenol instead.  Take with food. Do not operate any heavy machinery while you are using the Flexeril. Please take at night.   Low Back Strain With Rehab A strain is an injury in which a tendon or muscle is torn. The muscles and tendons of the lower back are vulnerable to strains. However, these muscles and tendons are very strong and require a great force to be injured. Strains are classified into three categories. Grade 1 strains cause pain, but the tendon is not lengthened. Grade 2 strains include a lengthened ligament, due to the ligament being stretched or partially ruptured. With grade 2 strains there is still function, although the function may be decreased. Grade 3 strains involve a complete tear of the tendon or muscle, and function is usually impaired. SYMPTOMS   Pain in the lower back.  Pain that  affects one side more than the other.  Pain that gets worse with movement and may be felt in the hip, buttocks, or back of the thigh.  Muscle spasms of the muscles in the back.  Swelling along the muscles of the back.  Loss of strength of the back muscles.  Crackling sound (crepitation) when the muscles are touched. CAUSES  Lower back strains occur when a force is placed on the muscles or tendons that is greater than they can handle. Common causes of injury include:  Prolonged overuse of the muscle-tendon units in the lower back, usually from incorrect posture.  A single violent injury or force applied to the back. RISK INCREASES WITH:  Sports that involve twisting forces on the spine or a lot of bending at the waist (football, rugby, weightlifting, bowling, golf, tennis, speed skating, racquetball, swimming, running, gymnastics, diving).  Poor strength and flexibility.  Failure to warm up properly before activity.  Family history of lower back pain or disk disorders.  Previous back injury or surgery (especially fusion).  Poor posture with lifting, especially heavy objects.  Prolonged sitting, especially with poor posture. PREVENTION   Learn and use proper posture when sitting or lifting (maintain proper posture when sitting, lift using the knees and legs, not at the waist).  Warm up and stretch properly before activity.  Allow for adequate recovery between workouts.  Maintain physical fitness:  Strength, flexibility, and endurance.  Cardiovascular fitness. PROGNOSIS  If treated properly, lower back strains usually  heal within 6 weeks. RELATED COMPLICATIONS   Recurring symptoms, resulting in a chronic problem.  Chronic inflammation, scarring, and partial muscle-tendon tear.  Delayed healing or resolution of symptoms.  Prolonged disability. TREATMENT  Treatment first involves the use of ice and medicine, to reduce pain and inflammation. The use of strengthening  and stretching exercises may help reduce pain with activity. These exercises may be performed at home or with a therapist. Severe injuries may require referral to a therapist for further evaluation and treatment, such as ultrasound. Your caregiver may advise that you wear a back brace or corset, to help reduce pain and discomfort. Often, prolonged bed rest results in greater harm then benefit. Corticosteroid injections may be recommended. However, these should be reserved for the most serious cases. It is important to avoid using your back when lifting objects. At night, sleep on your back on a firm mattress with a pillow placed under your knees. If non-surgical treatment is unsuccessful, surgery may be needed.  MEDICATION   If pain medicine is needed, nonsteroidal anti-inflammatory medicines (aspirin and ibuprofen), or other minor pain relievers (acetaminophen), are often advised.  Do not take pain medicine for 7 days before surgery.  Prescription pain relievers may be given, if your caregiver thinks they are needed. Use only as directed and only as much as you need.  Ointments applied to the skin may be helpful.  Corticosteroid injections may be given by your caregiver. These injections should be reserved for the most serious cases, because they may only be given a certain number of times. HEAT AND COLD  Cold treatment (icing) should be applied for 10 to 15 minutes every 2 to 3 hours for inflammation and pain, and immediately after activity that aggravates your symptoms. Use ice packs or an ice massage.  Heat treatment may be used before performing stretching and strengthening activities prescribed by your caregiver, physical therapist, or athletic trainer. Use a heat pack or a warm water soak. SEEK MEDICAL CARE IF:   Symptoms get worse or do not improve in 2 to 4 weeks, despite treatment.  You develop numbness, weakness, or loss of bowel or bladder function.  New, unexplained symptoms  develop. (Drugs used in treatment may produce side effects.) EXERCISES  RANGE OF MOTION (ROM) AND STRETCHING EXERCISES - Low Back Strain Most people with lower back pain will find that their symptoms get worse with excessive bending forward (flexion) or arching at the lower back (extension). The exercises which will help resolve your symptoms will focus on the opposite motion.  Your physician, physical therapist or athletic trainer will help you determine which exercises will be most helpful to resolve your lower back pain. Do not complete any exercises without first consulting with your caregiver. Discontinue any exercises which make your symptoms worse until you speak to your caregiver.  If you have pain, numbness or tingling which travels down into your buttocks, leg or foot, the goal of the therapy is for these symptoms to move closer to your back and eventually resolve. Sometimes, these leg symptoms will get better, but your lower back pain may worsen. This is typically an indication of progress in your rehabilitation. Be very alert to any changes in your symptoms and the activities in which you participated in the 24 hours prior to the change. Sharing this information with your caregiver will allow him/her to most efficiently treat your condition.  These exercises may help you when beginning to rehabilitate your injury. Your symptoms may resolve with  or without further involvement from your physician, physical therapist or athletic trainer. While completing these exercises, remember:  Restoring tissue flexibility helps normal motion to return to the joints. This allows healthier, less painful movement and activity.  An effective stretch should be held for at least 30 seconds.  A stretch should never be painful. You should only feel a gentle lengthening or release in the stretched tissue. FLEXION RANGE OF MOTION AND STRETCHING EXERCISES: STRETCH - Flexion, Single Knee to Chest   Lie on a firm  bed or floor with both legs extended in front of you.  Keeping one leg in contact with the floor, bring your opposite knee to your chest. Hold your leg in place by either grabbing behind your thigh or at your knee.  Pull until you feel a gentle stretch in your lower back. Hold __________ seconds.  Slowly release your grasp and repeat the exercise with the opposite side. Repeat __________ times. Complete this exercise __________ times per day.  STRETCH - Flexion, Double Knee to Chest   Lie on a firm bed or floor with both legs extended in front of you.  Keeping one leg in contact with the floor, bring your opposite knee to your chest.  Tense your stomach muscles to support your back and then lift your other knee to your chest. Hold your legs in place by either grabbing behind your thighs or at your knees.  Pull both knees toward your chest until you feel a gentle stretch in your lower back. Hold __________ seconds.  Tense your stomach muscles and slowly return one leg at a time to the floor. Repeat __________ times. Complete this exercise __________ times per day.  STRETCH - Low Trunk Rotation  Lie on a firm bed or floor. Keeping your legs in front of you, bend your knees so they are both pointed toward the ceiling and your feet are flat on the floor.  Extend your arms out to the side. This will stabilize your upper body by keeping your shoulders in contact with the floor.  Gently and slowly drop both knees together to one side until you feel a gentle stretch in your lower back. Hold for __________ seconds.  Tense your stomach muscles to support your lower back as you bring your knees back to the starting position. Repeat the exercise to the other side. Repeat __________ times. Complete this exercise __________ times per day  EXTENSION RANGE OF MOTION AND FLEXIBILITY EXERCISES: STRETCH - Extension, Prone on Elbows   Lie on your stomach on the floor, a bed will be too soft. Place your  palms about shoulder width apart and at the height of your head.  Place your elbows under your shoulders. If this is too painful, stack pillows under your chest.  Allow your body to relax so that your hips drop lower and make contact more completely with the floor.  Hold this position for __________ seconds.  Slowly return to lying flat on the floor. Repeat __________ times. Complete this exercise __________ times per day.  RANGE OF MOTION - Extension, Prone Press Ups  Lie on your stomach on the floor, a bed will be too soft. Place your palms about shoulder width apart and at the height of your head.  Keeping your back as relaxed as possible, slowly straighten your elbows while keeping your hips on the floor. You may adjust the placement of your hands to maximize your comfort. As you gain motion, your hands will come more underneath  your shoulders.  Hold this position __________ seconds.  Slowly return to lying flat on the floor. Repeat __________ times. Complete this exercise __________ times per day.  RANGE OF MOTION- Quadruped, Neutral Spine   Assume a hands and knees position on a firm surface. Keep your hands under your shoulders and your knees under your hips. You may place padding under your knees for comfort.  Drop your head and point your tail bone toward the ground below you. This will round out your lower back like an angry cat. Hold this position for __________ seconds.  Slowly lift your head and release your tail bone so that your back sags into a large arch, like an old horse.  Hold this position for __________ seconds.  Repeat this until you feel limber in your lower back.  Now, find your "sweet spot." This will be the most comfortable position somewhere between the two previous positions. This is your neutral spine. Once you have found this position, tense your stomach muscles to support your lower back.  Hold this position for __________ seconds. Repeat __________  times. Complete this exercise __________ times per day.  STRENGTHENING EXERCISES - Low Back Strain These exercises may help you when beginning to rehabilitate your injury. These exercises should be done near your "sweet spot." This is the neutral, low-back arch, somewhere between fully rounded and fully arched, that is your least painful position. When performed in this safe range of motion, these exercises can be used for people who have either a flexion or extension based injury. These exercises may resolve your symptoms with or without further involvement from your physician, physical therapist or athletic trainer. While completing these exercises, remember:   Muscles can gain both the endurance and the strength needed for everyday activities through controlled exercises.  Complete these exercises as instructed by your physician, physical therapist or athletic trainer. Increase the resistance and repetitions only as guided.  You may experience muscle soreness or fatigue, but the pain or discomfort you are trying to eliminate should never worsen during these exercises. If this pain does worsen, stop and make certain you are following the directions exactly. If the pain is still present after adjustments, discontinue the exercise until you can discuss the trouble with your caregiver. STRENGTHENING - Deep Abdominals, Pelvic Tilt  Lie on a firm bed or floor. Keeping your legs in front of you, bend your knees so they are both pointed toward the ceiling and your feet are flat on the floor.  Tense your lower abdominal muscles to press your lower back into the floor. This motion will rotate your pelvis so that your tail bone is scooping upwards rather than pointing at your feet or into the floor.  With a gentle tension and even breathing, hold this position for __________ seconds. Repeat __________ times. Complete this exercise __________ times per day.  STRENGTHENING - Abdominals, Crunches   Lie on a  firm bed or floor. Keeping your legs in front of you, bend your knees so they are both pointed toward the ceiling and your feet are flat on the floor. Cross your arms over your chest.  Slightly tip your chin down without bending your neck.  Tense your abdominals and slowly lift your trunk high enough to just clear your shoulder blades. Lifting higher can put excessive stress on the lower back and does not further strengthen your abdominal muscles.  Control your return to the starting position. Repeat __________ times. Complete this exercise __________ times per  day.  STRENGTHENING - Quadruped, Opposite UE/LE Lift   Assume a hands and knees position on a firm surface. Keep your hands under your shoulders and your knees under your hips. You may place padding under your knees for comfort.  Find your neutral spine and gently tense your abdominal muscles so that you can maintain this position. Your shoulders and hips should form a rectangle that is parallel with the floor and is not twisted.  Keeping your trunk steady, lift your right hand no higher than your shoulder and then your left leg no higher than your hip. Make sure you are not holding your breath. Hold this position __________ seconds.  Continuing to keep your abdominal muscles tense and your back steady, slowly return to your starting position. Repeat with the opposite arm and leg. Repeat __________ times. Complete this exercise __________ times per day.  STRENGTHENING - Lower Abdominals, Double Knee Lift  Lie on a firm bed or floor. Keeping your legs in front of you, bend your knees so they are both pointed toward the ceiling and your feet are flat on the floor.  Tense your abdominal muscles to brace your lower back and slowly lift both of your knees until they come over your hips. Be certain not to hold your breath.  Hold __________ seconds. Using your abdominal muscles, return to the starting position in a slow and controlled  manner. Repeat __________ times. Complete this exercise __________ times per day.  POSTURE AND BODY MECHANICS CONSIDERATIONS - Low Back Strain Keeping correct posture when sitting, standing or completing your activities will reduce the stress put on different body tissues, allowing injured tissues a chance to heal and limiting painful experiences. The following are general guidelines for improved posture. Your physician or physical therapist will provide you with any instructions specific to your needs. While reading these guidelines, remember:  The exercises prescribed by your provider will help you have the flexibility and strength to maintain correct postures.  The correct posture provides the best environment for your joints to work. All of your joints have less wear and tear when properly supported by a spine with good posture. This means you will experience a healthier, less painful body.  Correct posture must be practiced with all of your activities, especially prolonged sitting and standing. Correct posture is as important when doing repetitive low-stress activities (typing) as it is when doing a single heavy-load activity (lifting). RESTING POSITIONS Consider which positions are most painful for you when choosing a resting position. If you have pain with flexion-based activities (sitting, bending, stooping, squatting), choose a position that allows you to rest in a less flexed posture. You would want to avoid curling into a fetal position on your side. If your pain worsens with extension-based activities (prolonged standing, working overhead), avoid resting in an extended position such as sleeping on your stomach. Most people will find more comfort when they rest with their spine in a more neutral position, neither too rounded nor too arched. Lying on a non-sagging bed on your side with a pillow between your knees, or on your back with a pillow under your knees will often provide some relief.  Keep in mind, being in any one position for a prolonged period of time, no matter how correct your posture, can still lead to stiffness. PROPER SITTING POSTURE In order to minimize stress and discomfort on your spine, you must sit with correct posture. Sitting with good posture should be effortless for a healthy body. Returning  to good posture is a gradual process. Many people can work toward this most comfortably by using various supports until they have the flexibility and strength to maintain this posture on their own. When sitting with proper posture, your ears will fall over your shoulders and your shoulders will fall over your hips. You should use the back of the chair to support your upper back. Your lower back will be in a neutral position, just slightly arched. You may place a small pillow or folded towel at the base of your lower back for support.  When working at a desk, create an environment that supports good, upright posture. Without extra support, muscles tire, which leads to excessive strain on joints and other tissues. Keep these recommendations in mind: CHAIR:  A chair should be able to slide under your desk when your back makes contact with the back of the chair. This allows you to work closely.  The chair's height should allow your eyes to be level with the upper part of your monitor and your hands to be slightly lower than your elbows. BODY POSITION  Your feet should make contact with the floor. If this is not possible, use a foot rest.  Keep your ears over your shoulders. This will reduce stress on your neck and lower back. INCORRECT SITTING POSTURES  If you are feeling tired and unable to assume a healthy sitting posture, do not slouch or slump. This puts excessive strain on your back tissues, causing more damage and pain. Healthier options include:  Using more support, like a lumbar pillow.  Switching tasks to something that requires you to be upright or  walking.  Talking a brief walk.  Lying down to rest in a neutral-spine position. PROLONGED STANDING WHILE SLIGHTLY LEANING FORWARD  When completing a task that requires you to lean forward while standing in one place for a long time, place either foot up on a stationary 2-4 inch high object to help maintain the best posture. When both feet are on the ground, the lower back tends to lose its slight inward curve. If this curve flattens (or becomes too large), then the back and your other joints will experience too much stress, tire more quickly, and can cause pain. CORRECT STANDING POSTURES Proper standing posture should be assumed with all daily activities, even if they only take a few moments, like when brushing your teeth. As in sitting, your ears should fall over your shoulders and your shoulders should fall over your hips. You should keep a slight tension in your abdominal muscles to brace your spine. Your tailbone should point down to the ground, not behind your body, resulting in an over-extended swayback posture.  INCORRECT STANDING POSTURES  Common incorrect standing postures include a forward head, locked knees and/or an excessive swayback. WALKING Walk with an upright posture. Your ears, shoulders and hips should all line-up. PROLONGED ACTIVITY IN A FLEXED POSITION When completing a task that requires you to bend forward at your waist or lean over a low surface, try to find a way to stabilize 3 out of 4 of your limbs. You can place a hand or elbow on your thigh or rest a knee on the surface you are reaching across. This will provide you more stability so that your muscles do not fatigue as quickly. By keeping your knees relaxed, or slightly bent, you will also reduce stress across your lower back. CORRECT LIFTING TECHNIQUES DO :   Assume a wide stance. This will provide  you more stability and the opportunity to get as close as possible to the object which you are lifting.  Tense your  abdominals to brace your spine. Bend at the knees and hips. Keeping your back locked in a neutral-spine position, lift using your leg muscles. Lift with your legs, keeping your back straight.  Test the weight of unknown objects before attempting to lift them.  Try to keep your elbows locked down at your sides in order get the best strength from your shoulders when carrying an object.  Always ask for help when lifting heavy or awkward objects. INCORRECT LIFTING TECHNIQUES DO NOT:   Lock your knees when lifting, even if it is a small object.  Bend and twist. Pivot at your feet or move your feet when needing to change directions.  Assume that you can safely pick up even a paper clip without proper posture.   This information is not intended to replace advice given to you by your health care provider. Make sure you discuss any questions you have with your health care provider.   Document Released: 11/15/2005 Document Revised: 12/06/2014 Document Reviewed: 02/27/2009 [employer] Interactive Patient Education [employer]

## 2016-06-29 ENCOUNTER — Ambulatory Visit (INDEPENDENT_AMBULATORY_CARE_PROVIDER_SITE_OTHER): Payer: Worker's Compensation | Admitting: Physician Assistant

## 2016-06-29 VITALS — BP 138/84 | HR 112 | Temp 98.5°F | Resp 17 | Ht 69.0 in | Wt 261.0 lb

## 2016-06-29 DIAGNOSIS — W11XXXD Fall on and from ladder, subsequent encounter: Secondary | ICD-10-CM

## 2016-06-29 DIAGNOSIS — M545 Low back pain: Secondary | ICD-10-CM

## 2016-06-29 DIAGNOSIS — S39012A Strain of muscle, fascia and tendon of lower back, initial encounter: Secondary | ICD-10-CM

## 2016-06-29 NOTE — Progress Notes (Signed)
Patient ID: Jonathan Brooks, male   DOB: [birth year], 45 y.o.   MRN: [id number] [facility] [address], [place] [address] [phone]  Date:  06/29/2016   Name:  Jonathan Brooks   DOB:  11-15-1971   MRN:  [id number]  PCP:  No PCP Per Patient   By signing my name below, I, Essence Howell, attest that this documentation has been prepared under the direction and in the presence of Trena Platt, PA-C Electronically Signed: Charline Bills, ED Scribe 06/29/2016 at 12:02 PM.  History of Present Illness:  Jonathan Brooks is a 45 y.o. male patient who presents to [facility] for a worker's comp follow-up. Pt was seen in the office on 06/21/16 following a 2 ft fall from for a ladder that occurred the day prior. Pt landed on his back and denied hitting his head. Since the visit, he has been compliant with Flexeril x1 daily, Tylenol and a few stretches with significant relief. Pt has also tried icy hot nightly with relief. He states that he stopped Mobic since it did not provided any significant relief. Pt denies dizziness.   No Known Allergies  Medication list has been reviewed and updated.  Current Outpatient Prescriptions on File Prior to Visit  Medication Sig Dispense Refill   cyclobenzaprine (FLEXERIL) 5 MG tablet Take 1 tablet (5 mg total) by mouth 3 (three) times daily as needed for muscle spasms. 30 tablet 1   meloxicam (MOBIC) 15 MG tablet Take 1 tablet (15 mg total) by mouth daily. (Patient not taking: Reported on 06/29/2016) 14 tablet 1   No current facility-administered medications on file prior to visit.     Review of Systems  Musculoskeletal: Positive for back pain.  Neurological: Negative for dizziness.    Physical Examination: BP 138/84 (BP Location: Right Arm, Patient Position: Sitting, Cuff Size: Normal)    Pulse (!) 112    Temp 98.5 F (36.9 C) (Oral)    Resp 17    Ht 5\' 9"  (1.753 m)    Wt 261 lb (118.4 kg)    SpO2 96%    BMI 38.54 kg/m  Ideal Body  Weight: @FLOWAMB ([id number])@  Physical Exam  Constitutional: He is oriented to person, place, and time. He appears well-developed and well-nourished. No distress.  HENT:  Head: Normocephalic and atraumatic.  Eyes: Conjunctivae and EOM are normal. Pupils are equal, round, and reactive to light.  Cardiovascular: Normal rate and regular rhythm.  Exam reveals no gallop and no friction rub.   No murmur heard. Pulmonary/Chest: Effort normal. No respiratory distress. He has no decreased breath sounds. He has no wheezes. He has no rhonchi. He has no rales.  Musculoskeletal:  Tenderness on lower lumbar spine but more so on adjacent musculature (< R sided)  Neurological: He is alert and oriented to person, place, and time.  Skin: Skin is warm and dry. He is not diaphoretic.  Psychiatric: He has a normal mood and affect. His behavior is normal.    Assessment and Plan: Jonathan Brooks is a 45 y.o. male who is here today for cc of fall from ladder. Advised continuing the muscle relaxant.  Advised restrictions via letter.    Fall from ladder, subsequent encounter  Back strain, initial encounter  Midline low back pain, with sciatica presence unspecified  Trena Platt, PA-C [facility] 06/29/2016 11:36 AM

## 2016-06-29 NOTE — Patient Instructions (Addendum)
     IF you received an x-ray today, you will receive an invoice from [facility]. Please contact [facility] at [phone] with questions or concerns regarding your invoice.   IF you received labwork today, you will receive an invoice from [employer]. Please contact [facility] at [phone] with questions or concerns regarding your invoice.   Our billing staff will not be able to assist you with questions regarding bills from these companies.  You will be contacted with the lab results as soon as they are available. The fastest way to get your results is to activate your My Chart account. Instructions are located on the last page of this paperwork. If you have not heard from [place] regarding the results in 2 weeks, please contact this office.    Please continue to do the stretches, and follow the restrictions as advised.   Please also ice.  You can continue the flexeril, but not with operating heavy machinery.

## 2016-07-06 ENCOUNTER — Ambulatory Visit: Payer: Worker's Compensation

## 2016-07-06 ENCOUNTER — Ambulatory Visit (INDEPENDENT_AMBULATORY_CARE_PROVIDER_SITE_OTHER): Payer: Worker's Compensation | Admitting: Emergency Medicine

## 2016-07-06 VITALS — BP 140/80 | HR 88 | Temp 98.0°F | Resp 18 | Ht 69.0 in | Wt 256.0 lb

## 2016-07-06 DIAGNOSIS — W11XXXD Fall on and from ladder, subsequent encounter: Secondary | ICD-10-CM

## 2016-07-06 DIAGNOSIS — S39012A Strain of muscle, fascia and tendon of lower back, initial encounter: Secondary | ICD-10-CM | POA: Diagnosis not present

## 2016-07-06 DIAGNOSIS — M545 Low back pain: Secondary | ICD-10-CM

## 2016-07-06 NOTE — Patient Instructions (Signed)
     IF you received an x-ray today, you will receive an invoice from [facility]. Please contact [facility] at [phone] with questions or concerns regarding your invoice.   IF you received labwork today, you will receive an invoice from [facility]. Please contact [facility] at [phone] with questions or concerns regarding your invoice.   Our billing staff will not be able to assist you with questions regarding bills from these companies.  You will be contacted with the lab results as soon as they are available. The fastest way to get your results is to activate your My Chart account. Instructions are located on the last page of this paperwork. If you have not heard from us regarding the results in 2 weeks, please contact this office.      

## 2016-07-06 NOTE — Progress Notes (Signed)
Patient ID: Jonathan Brooks, male   DOB: Apr 15, 1971, 45 y.o.   MRN: [id number]    By signing my name below, I, Essence Howell, attest that this documentation has been prepared under the direction and in the presence of Collene GobbleSteven A Clydell Sposito, MD Electronically Signed: Charline BillsEssence Howell, ED Scribe 07/06/2016 at 10:11 AM.  Chief Complaint:  Chief Complaint  Patient presents with  . Follow-up    workers comp   HPI: Jonathan Brooks is a [age] y.o. male who [facility] today for a worker's comp follow-up. Pt was seen in the office 2 weeks ago following a 2 ft slip and fall from a ladder. Pt states that he landed on his back with a hammer in his back pocket. He denies hitting his head. Pt is currently taking Tylenol and Flexeril nightly with significant relief.    No Known Allergies Prior to Admission medications   Medication Sig Start Date End Date Taking? Authorizing Provider  cyclobenzaprine (FLEXERIL) 5 MG tablet Take 1 tablet (5 mg total) by mouth 3 (three) times daily as needed for muscle spasms. Patient not taking: Reported on 07/06/2016 06/22/16   Collie SiadStephanie D English, PA  meloxicam (MOBIC) 15 MG tablet Take 1 tablet (15 mg total) by mouth daily. Patient not taking: Reported on 06/29/2016 06/22/16 07/06/16  Collie SiadStephanie D English, PA   ROS: The patient denies fevers, chills, night sweats, unintentional weight loss, chest pain, palpitations, wheezing, dyspnea on exertion, nausea, vomiting, abdominal pain, dysuria, hematuria, melena, numbness, weakness, or tingling.   All other systems have been reviewed and were otherwise negative with the exception of those mentioned in the HPI and as above.    PHYSICAL EXAM: Vitals:   07/06/16 0952  BP: 140/80  Pulse: 88  Resp: 18  Temp: 98 F (36.7 C)   Body mass index is 37.8 kg/m.  General: Alert, no acute distress HEENT:  Normocephalic, atraumatic, oropharynx patent. Eye: Nonie HoyerOMI, [facility] Cardiovascular: Regular rate and rhythm, no rubs murmurs or gallops. No  Carotid bruits, radial pulse intact. No pedal edema.  Respiratory: Clear to auscultation bilaterally.  No wheezes, rales, or rhonchi.  No cyanosis, no use of accessory musculature Abdominal: No organomegaly, abdomen is soft and non-tender, positive bowel sounds. No masses. Musculoskeletal: Gait intact. No edema. Full ROM. When he twists completely to the R or L there is a popping noise but no discomfort with that.  Skin: No rashes. Neurologic: Facial musculature symmetric. Reflexes 5/5.  Psychiatric: Patient acts appropriately throughout our interaction. Lymphatic: No cervical or submandibular lymphadenopathy  LABS:  EKG/XRAY:   Primary read interpreted by [facility]aub [facility]. Dg Lumbar Spine 2-3 Views  Result Date: 07/06/2016 CLINICAL DATA:  Larey SeatFell off ladder 2 weeks ago with low back pain EXAM: LUMBAR SPINE - 2-3 VIEW COMPARISON:  CT abdomen pelvis of 09/18/2010 FINDINGS: The lumbar vertebrae are straightened in alignment. Intervertebral disc spaces appear normal. No compression deformity is seen. The SI joints are corticated. IMPRESSION: Straightened alignment with normal intervertebral disc spaces. No acute abnormality per Electronically Signed   By: Dwyane DeePaul  Barry M.D.   On: 07/06/2016 10:48   Dg Sacrum/coccyx  Result Date: 06/22/2016 CLINICAL DATA:  Left-sided back pain after jumping off ladder EXAM: SACRUM AND COCCYX - 2+ VIEW COMPARISON:  CT abdomen pelvis of 09/18/2010 FINDINGS: The sacrococcygeal elements are in normal alignment. No fracture is seen. The pelvic rami are intact. Both hips appear to be normally position. The SI joints are corticated. The sacral foramina appear corticated.  IMPRESSION: Negative. Electronically Signed   By: Dwyane Dee M.D.   On: 06/22/2016 12:07  Ct Head Wo Contrast  Result Date: 06/22/2016 CLINICAL DATA:  Larey Seat yesterday, now with dizziness after hitting the back of the head, headache EXAM: CT HEAD WITHOUT CONTRAST TECHNIQUE: Contiguous axial images were obtained  from the base of the skull through the vertex without intravenous contrast. COMPARISON:  None. FINDINGS: The ventricular system is normal in size and configuration, and the septum is in a normal midline position. The fourth ventricle and basilar cisterns are unremarkable. No hemorrhage, mass lesion, or acute infarction is seen. On bone window images, no calvarial abnormality is seen. The sinuses appear well pneumatized. IMPRESSION: No acute intracranial abnormality. Electronically Signed   By: Dwyane Dee M.D.   On: 06/22/2016 16:34  Dg Hip Unilat W Or W/o Pelvis 2-3 Views Left  Result Date: 06/22/2016 CLINICAL DATA:  Left-sided back pain after jumping off ladder EXAM: DG HIP (WITH OR WITHOUT PELVIS) 2-3V LEFT COMPARISON:  None. FINDINGS: The hip joint spaces appear well preserved. No fracture is seen. The pelvic rami are intact. IMPRESSION: Negative. Electronically Signed   By: Dwyane Dee M.D.   On: 06/22/2016 12:08  ASSESSMENT/PLAN: Physical exam is normal. Patient released to regular duty. He can continue Tylenol during the day. It would be good for him to do back stretches before. He can finish out his Flexeril at bedtime.I personally performed the services described in this documentation, which was scribed in my presence. The recorded information has been reviewed and is accurate.  Gross sideeffects, risk and benefits, and alternatives of medications d/w patient. Patient is aware that all medications have potential sideeffects and we are unable to predict every sideeffect or drug-drug interaction that may occur.  Lesle Chris MD 07/06/2016 10:53 AM

## 2017-07-02 ENCOUNTER — Emergency Department (HOSPITAL_COMMUNITY)
Admission: EM | Admit: 2017-07-02 | Discharge: 2017-07-30 | Disposition: E | Payer: Self-pay | Attending: Emergency Medicine | Admitting: Emergency Medicine

## 2017-07-02 DIAGNOSIS — T148XXA Other injury of unspecified body region, initial encounter: Secondary | ICD-10-CM

## 2017-07-02 DIAGNOSIS — J942 Hemothorax: Secondary | ICD-10-CM | POA: Insufficient documentation

## 2017-07-02 DIAGNOSIS — I469 Cardiac arrest, cause unspecified: Secondary | ICD-10-CM | POA: Insufficient documentation

## 2017-07-02 LAB — PREPARE FRESH FROZEN PLASMA
UNIT DIVISION: 0
Unit division: 0

## 2017-07-02 LAB — BPAM FFP
BLOOD PRODUCT EXPIRATION DATE: 201808082359
Blood Product Expiration Date: 201808082359
ISSUE DATE / TIME: 201808041801
ISSUE DATE / TIME: 201808041801
UNIT TYPE AND RH: 6200
Unit Type and Rh: 6200

## 2017-07-02 MED ORDER — ROCURONIUM BROMIDE 50 MG/5ML IV SOLN
INTRAVENOUS | Status: AC | PRN
  Administered 2017-07-02: 20 mg via INTRAVENOUS

## 2017-07-02 MED ORDER — ROCURONIUM BROMIDE 50 MG/5ML IV SOLN
INTRAVENOUS | Status: AC | PRN
  Administered 2017-07-02: 100 mg via INTRAVENOUS

## 2017-07-02 MED ORDER — EPINEPHRINE PF 1 MG/10ML IJ SOSY
PREFILLED_SYRINGE | INTRAMUSCULAR | Status: AC | PRN
  Administered 2017-07-02 (×4): 1 via INTRAVENOUS

## 2017-07-03 LAB — BPAM RBC
BLOOD PRODUCT EXPIRATION DATE: 201808062359
BLOOD PRODUCT EXPIRATION DATE: 201808072359
ISSUE DATE / TIME: 201808050455
ISSUE DATE / TIME: 201808050455
UNIT TYPE AND RH: 9500
UNIT TYPE AND RH: 9500

## 2017-07-03 LAB — TYPE AND SCREEN
UNIT DIVISION: 0
UNIT DIVISION: 0

## 2017-07-05 MED FILL — Medication: Qty: 1 | Status: AC

## 2017-07-30 NOTE — ED Notes (Signed)
Patient time of death occurred at 1834 

## 2017-07-30 NOTE — ED Notes (Signed)
EDP attempting to intubate. Anesthesia paged
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
## 2017-07-30 NOTE — Progress Notes (Signed)
Patient ID: Jonathan Brooks, male   DOB: 22-Mar-1971, 46 y.o.   MRN: [id number] Chest Tube Insertion Procedure Note  Indications:  Chest trauma receiving cpr  Pre-operative Diagnosis: Pneumothorax  Post-operative Diagnosis: Pneumothorax/hemothorax  Procedure Details  Emergency procedure as patient was receiving cpr  After sterile skin prep, using standard technique, a 28 French tube was placed in the left lateral rib space.  Findings: hptx   Estimated Blood Loss:  200 cc         Specimens:  None              Complications:  cpr continued         Disposition: cpr continued         Condition: expired  Attending Attestation: I performed the procedure.

## 2017-07-30 NOTE — Procedures (Signed)
Chest Tube Insertion Procedure Note  Indications:  Chest trauma receiving cpr  Pre-operative Diagnosis: Pneumothorax  Post-operative Diagnosis: Pneumothorax  Procedure Details  Emergency procedure as patient was receiving cpr  After sterile skin prep, using standard technique, a 20 French tube was placed in the right lateral rib space.  Findings: ptx   Estimated Blood Loss:  Minimal         Specimens:  None              Complications:  cpr continued         Disposition: cpr continued         Condition: expired  Attending Attestation: I performed the procedure.
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
## 2017-07-30 NOTE — ED Notes (Signed)
EDP unable to get pt intubated. Review of code/care with staff in room, EDP, and Trauma MD. Pt arrived to ED 1809 with CPR in progress. Prior with no pulses and no exact downtime. Pt jaw rigid but able to ventilate with BVM without difficulty.

## 2017-07-30 NOTE — ED Notes (Signed)
Family coming to bedside.  
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
## 2017-07-30 NOTE — ED Notes (Signed)
EDP attempting to intubate 

## 2017-07-30 NOTE — Procedures (Signed)
Central Venous Catheter Insertion Procedure Note Jonathan OhmsRicardo A Brooks [id number] 03/19/1971  Procedure: Insertion of Central Venous Catheter Indications: code need access  Procedure Details Consent: Unable to obtain consent because of emergent medical necessity.  Skin prep: iodine A antimicrobial bonded/coated triple lumen catheter was placed in the right femoral vein due to emergent situation using the Seldinger technique.  Evaluation Blood flow good Complications: No apparent complications  [name] 07/09/2017, 6:49 PM
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
## 2017-07-30 NOTE — ED Notes (Signed)
Preparing to intubate.  

## 2017-07-30 NOTE — ED Notes (Signed)
EDP in with chaplain to speak with family.
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
## 2017-07-30 DEATH — deceased

## 2018-01-27 IMAGING — DX DG LUMBAR SPINE 2-3V
3 series · 3 of 3 positions shown · non-contrast
Comparison: CT abdomen pelvis of 09/18/2010

CLINICAL DATA: Fell off ladder 2 weeks ago with low back pain

EXAM:
LUMBAR SPINE - 2-3 VIEW

[l-spine ap]
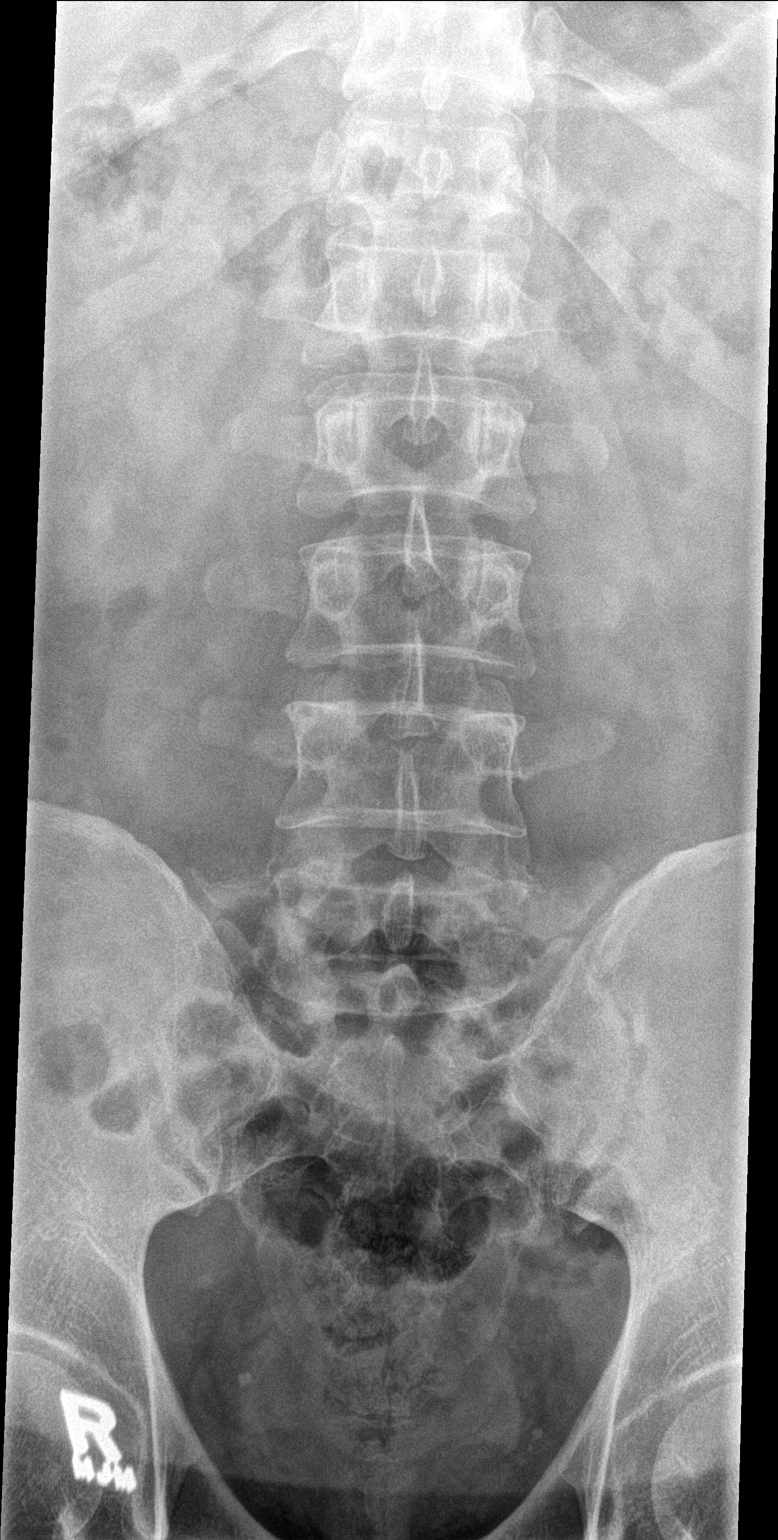

[l-spine lat]
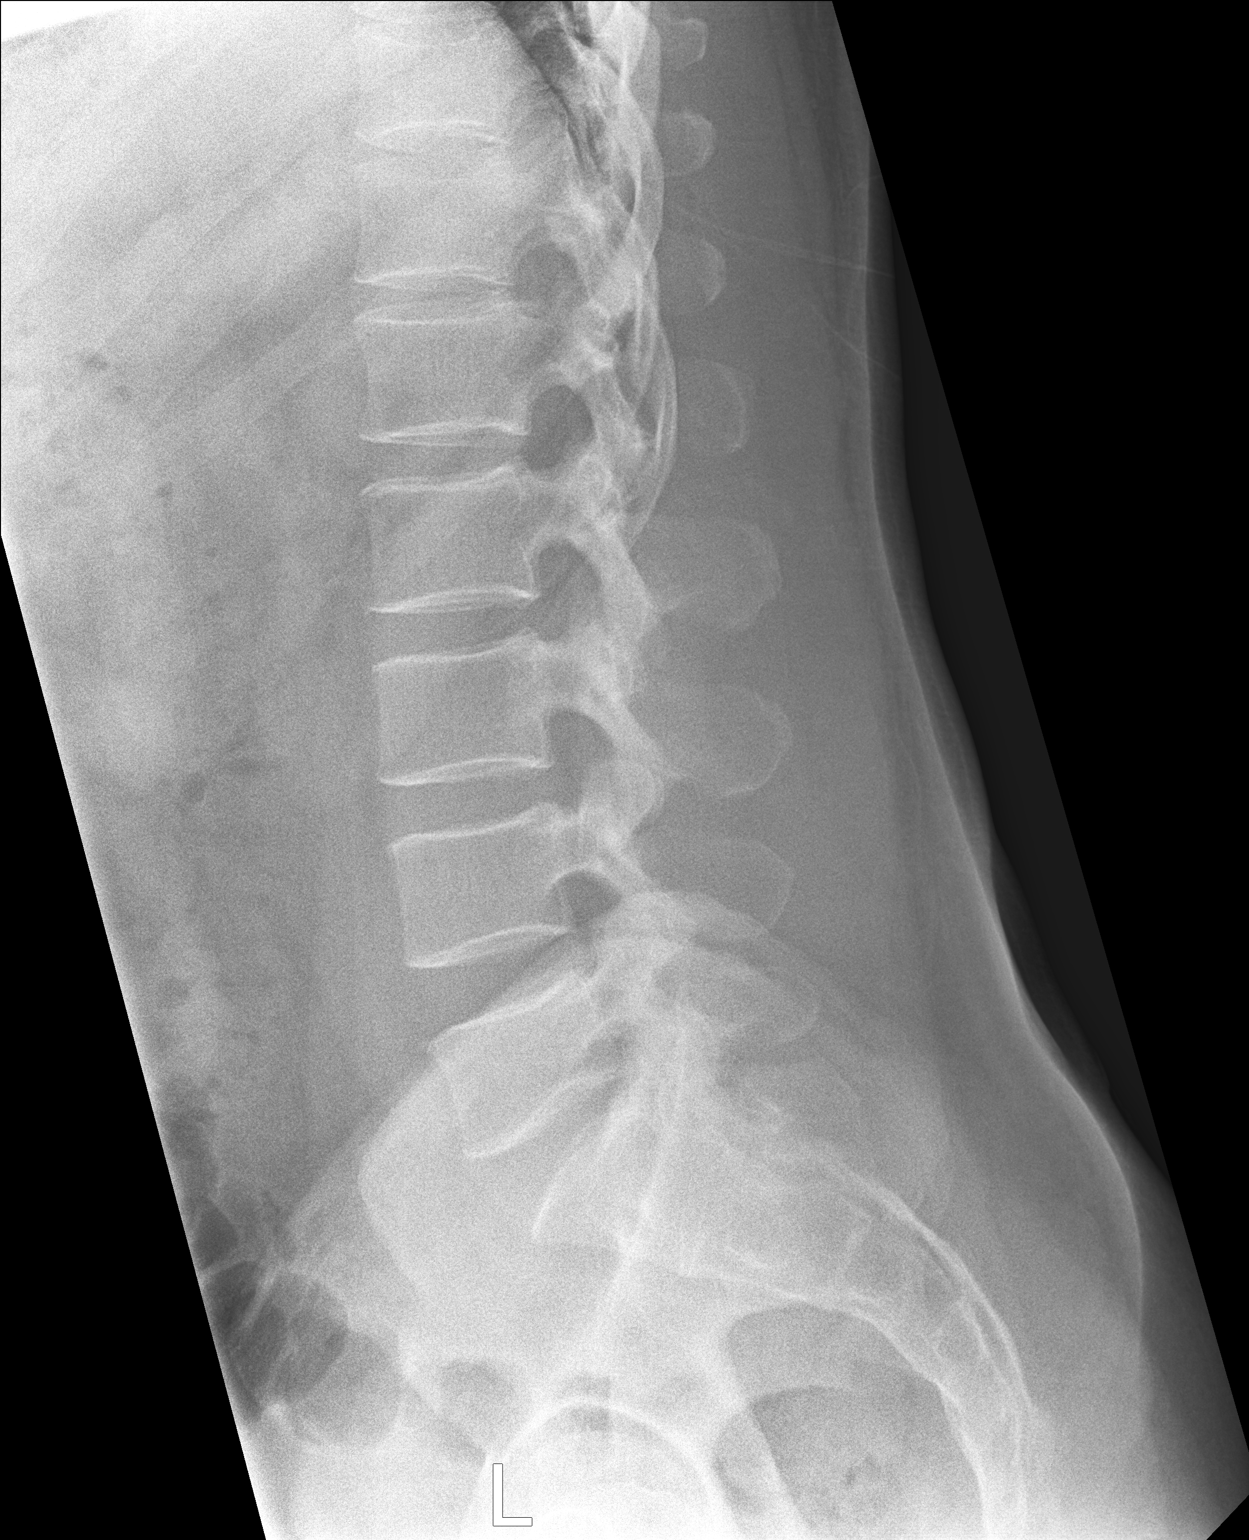

[l-spine l5-s1]
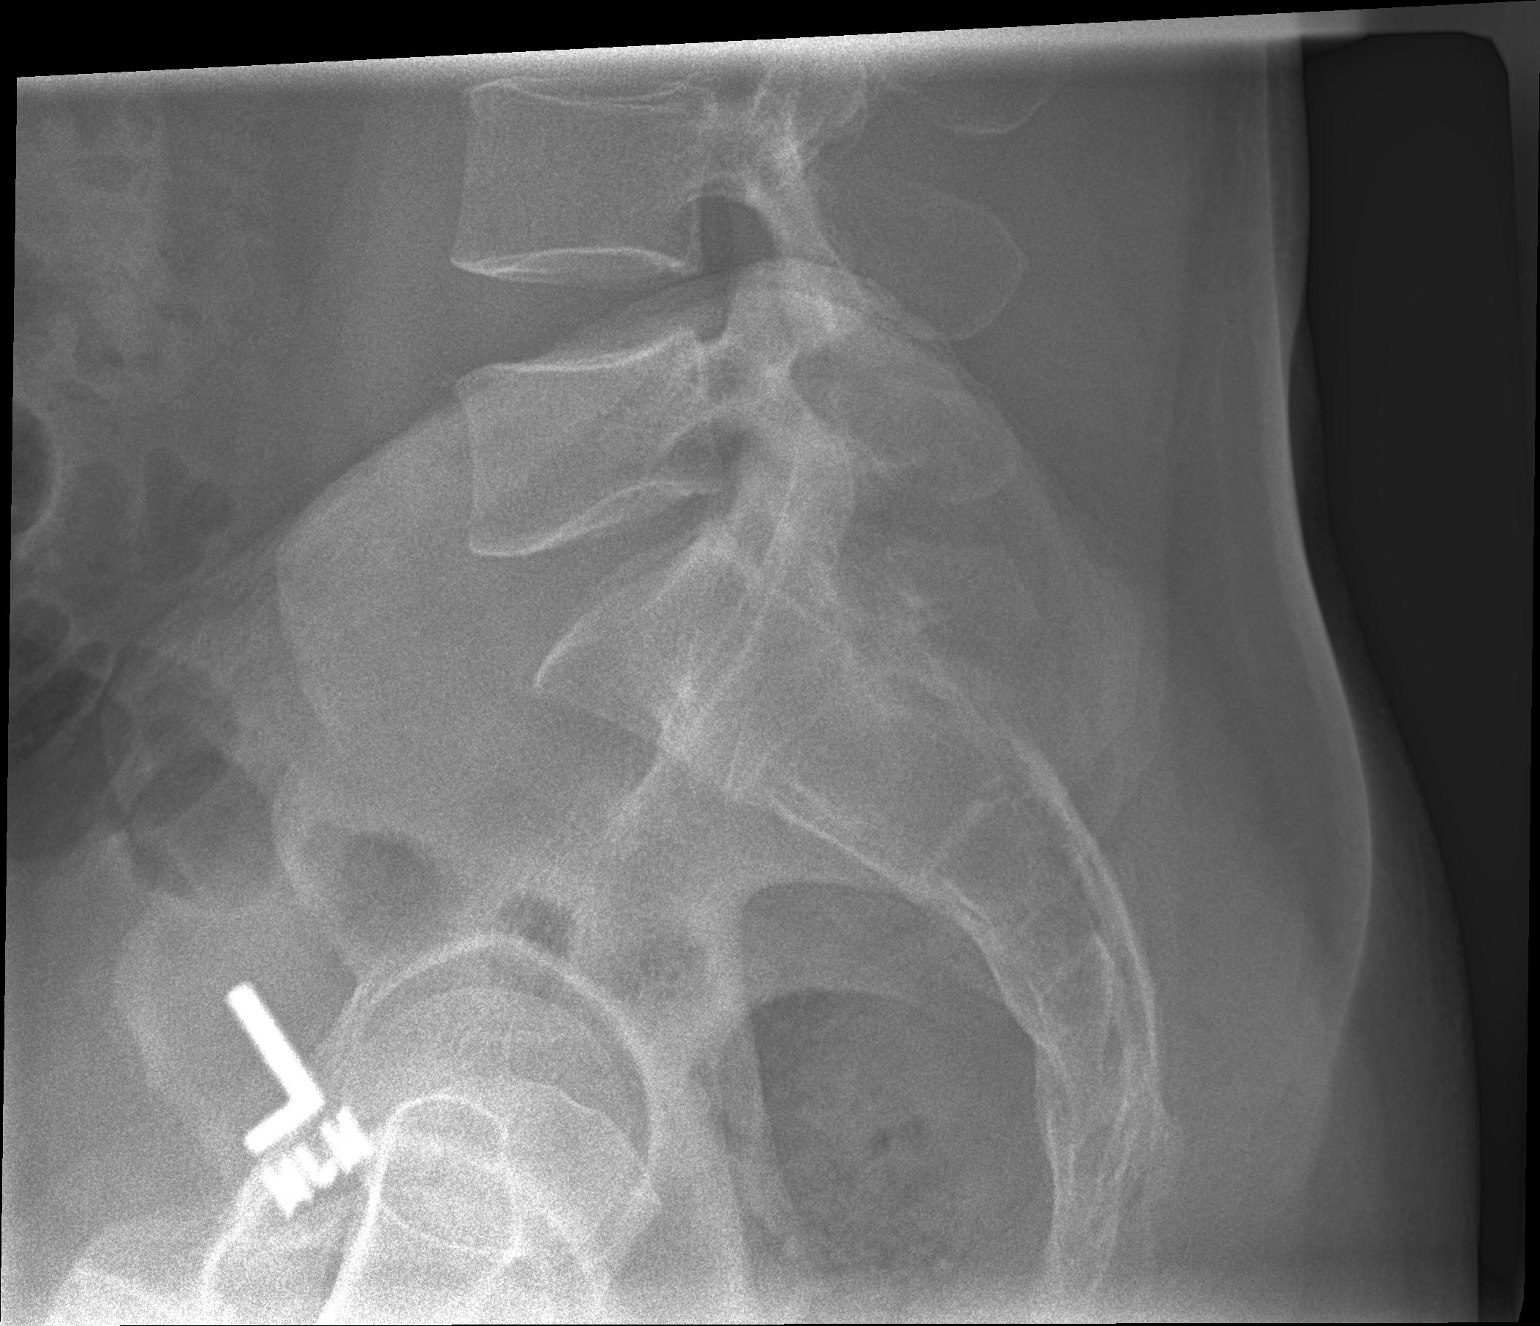

[3 of 3 positions shown; findings below may reference images not displayed]

FINDINGS: The lumbar vertebrae are straightened in alignment. Intervertebral
disc spaces appear normal. No compression deformity is seen. The SI
joints are corticated.
IMPRESSION: Straightened alignment with normal intervertebral disc spaces. No
acute abnormality per
